# Patient Record
Sex: Female | Born: 1975 | Race: Black or African American | Hispanic: No | Marital: Married | Smoking: Never smoker
Health system: Southern US, Community
[De-identification: ages and names within clinical notes are randomized; demographics above are authoritative.]

## PROBLEM LIST (undated history)

## (undated) DIAGNOSIS — R569 Unspecified convulsions: Secondary | ICD-10-CM

## (undated) DIAGNOSIS — I10 Essential (primary) hypertension: Secondary | ICD-10-CM

## (undated) DIAGNOSIS — G44309 Post-traumatic headache, unspecified, not intractable: Secondary | ICD-10-CM

## (undated) DIAGNOSIS — S0990XS Unspecified injury of head, sequela: Secondary | ICD-10-CM

## (undated) HISTORY — PX: OTHER SURGICAL HISTORY: SHX169

## (undated) HISTORY — PX: APPENDECTOMY: SHX54

## (undated) HISTORY — PX: HERNIA REPAIR: SHX51

## (undated) HISTORY — PX: ABDOMINAL HYSTERECTOMY: SHX81

---

## 2019-10-01 ENCOUNTER — Encounter (HOSPITAL_COMMUNITY): Payer: Self-pay

## 2019-10-01 ENCOUNTER — Emergency Department (HOSPITAL_COMMUNITY)
Admission: EM | Admit: 2019-10-01 | Discharge: 2019-10-01 | Disposition: A | Discharge status: Home / Self Care | Payer: Medicare Other | Attending: Emergency Medicine | Admitting: Emergency Medicine

## 2019-10-01 ENCOUNTER — Emergency Department (HOSPITAL_COMMUNITY): Payer: Medicare Other

## 2019-10-01 DIAGNOSIS — R110 Nausea: Secondary | ICD-10-CM | POA: Insufficient documentation

## 2019-10-01 DIAGNOSIS — R42 Dizziness and giddiness: Secondary | ICD-10-CM | POA: Insufficient documentation

## 2019-10-01 DIAGNOSIS — R519 Headache, unspecified: Secondary | ICD-10-CM

## 2019-10-01 DIAGNOSIS — H53149 Visual discomfort, unspecified: Secondary | ICD-10-CM | POA: Insufficient documentation

## 2019-10-01 HISTORY — DX: Unspecified injury of head, sequela: G44.309

## 2019-10-01 HISTORY — DX: Unspecified injury of head, sequela: S09.90XS

## 2019-10-01 HISTORY — DX: Essential (primary) hypertension: I10

## 2019-10-01 HISTORY — DX: Unspecified convulsions: R56.9

## 2019-10-01 LAB — BASIC METABOLIC PANEL
Anion gap: 10 (ref 5–15)
BUN: 10 mg/dL (ref 6–20)
CO2: 26 mmol/L (ref 22–32)
Calcium: 9.1 mg/dL (ref 8.9–10.3)
Chloride: 103 mmol/L (ref 98–111)
Creatinine, Ser: 0.95 mg/dL (ref 0.44–1.00)
GFR calc Af Amer: >60 mL/min (ref >60)
GFR calc non Af Amer: >60 mL/min (ref >60)
Glucose, Bld: 92 mg/dL (ref 70–99)
Potassium: 3.7 mmol/L (ref 3.5–5.1)
Sodium: 139 mmol/L (ref 135–145)

## 2019-10-01 LAB — CBC
HCT: 38.7 % (ref 36.0–46.0)
Hemoglobin: 12.3 g/dL (ref 12.0–15.0)
MCH: 27.6 pg (ref 26.0–34.0)
MCHC: 31.8 g/dL (ref 30.0–36.0)
MCV: 87 fL (ref 80.0–100.0)
Platelets: 286 10*3/uL (ref 150–400)
RBC: 4.45 MIL/uL (ref 3.87–5.11)
RDW: 12.6 % (ref 11.5–15.5)
WBC: 4.3 10*3/uL (ref 4.0–10.5)
nRBC: 0 % (ref 0.0–0.2)

## 2019-10-01 MED ORDER — MAGNESIUM SULFATE IN D5W 1-5 GM/100ML-% IV SOLN
1.0000 g | Freq: Once | INTRAVENOUS | Status: AC
  Administered 2019-10-01: 1 g via INTRAVENOUS
  Filled 2019-10-01: qty 100

## 2019-10-01 MED ORDER — SODIUM CHLORIDE 0.9 % IV BOLUS
500.0000 mL | Freq: Once | INTRAVENOUS | Status: AC
  Administered 2019-10-01: 500 mL via INTRAVENOUS

## 2019-10-01 MED ORDER — DIPHENHYDRAMINE HCL 25 MG PO CAPS
50.0000 mg | ORAL_CAPSULE | Freq: Once | ORAL | Status: AC
  Administered 2019-10-01: 50 mg via ORAL
  Filled 2019-10-01: qty 2

## 2019-10-01 MED ORDER — DEXAMETHASONE SODIUM PHOSPHATE 10 MG/ML IJ SOLN
10.0000 mg | Freq: Once | INTRAMUSCULAR | Status: AC
  Administered 2019-10-01: 10 mg via INTRAVENOUS
  Filled 2019-10-01: qty 1

## 2019-10-01 MED ORDER — ONDANSETRON HCL 4 MG/2ML IJ SOLN
4.0000 mg | Freq: Once | INTRAMUSCULAR | Status: AC
  Administered 2019-10-01: 4 mg via INTRAVENOUS
  Filled 2019-10-01: qty 2

## 2019-10-01 NOTE — ED Provider Notes (Signed)
[facility] EMERGENCY DEPARTMENT Provider Note   CSN: [id number] Arrival date & time: 10/01/19  7616     History Chief Complaint  Patient presents with  . Chest Pain   Angela Dorsey is a 44 y.o. female with history of chronic headaches presents to the ER for evaluation of headaches.  Reports headache onset since 2001, she has had intermittent headaches occasionally since.  For the last 1 week she has not been feeling well, has not been sleeping well, has had difficulty concentrating and has had intermittent headaches.  Her headache worsened last night, states it initially was a mild headache and it gradually worsened.  Has noticed photosensitivity, lightheadedness if she stands up too quickly, worsening headache and nausea.  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  She thinks they were "expirimenting" on her with these medicines.  Remembers she was given Phenergan and Dilaudid through her IV during that ER visit.  As patient was in rehab and getting clean from multiple drug use her psychiatrist put her on Zoloft and Risperdal which she thinks made everything worse.  Patient believes that the initial migraine cocktail given to her in the ER and psychiatric medicines affected her brain chemistry.  She ultimately stopped taking all of these medicines and she has been clean for several years.  Currently she is going to school for biochemistry because she wants to study how drugs and medicines affect your brain.  Admits that lately she has been more stressed and busy working and studying.  States she is not sleeping well or taking care of herself.  Believes her lack of sleep and taking care of herself has caused this headache.  Usually at home she takes Benadryl, turmeric and pepper but these did not help.   States last time she was treated in the ER for headache she was given IV Benadryl morphine which worked.  States she cannot take ibuprofen due to ulcers or tylenol due to previous "live enzymes".  No CP and SOB.  Denies any head trauma.  Denies any fever, neck stiffness.  Denies any double vision.  No vomiting.  No stroke symptoms like difficulty with speech, one-sided weakness or numbness, difficulty with walking.  Patient is currently on her laptop, sitting up listening to a lecture.  HPI     Past Medical History:  Diagnosis Date  . Headaches due to old head injury   . Hypertension   . Seizures (HCC)     There are no problems to display for this patient.   Past Surgical History:  Procedure Laterality Date  . ABDOMINAL HYSTERECTOMY    . APPENDECTOMY    . HERNIA REPAIR    . pinky       OB History   No obstetric history on file.     No family history on file.  Social History   Tobacco Use  . Smoking status: Never Smoker  . Smokeless tobacco: Never Used  Substance Use Topics  . Alcohol use: Yes  . Drug use: Never    Home Medications Prior to Admission medications   Not on File    Allergies    Other, Tramadol, Ansaid [flurbiprofen], Penicillins, Reglan [metoclopramide], Shellfish allergy, and Toradol [ketorolac tromethamine]  Review of Systems   Review of Systems  Eyes: Positive for photophobia.  Gastrointestinal: Positive for nausea.  Neurological: Positive for light-headedness and headaches.  All other systems reviewed and are negative.   Physical Exam Updated Vital Signs BP 120/67   Pulse (!) 58   Temp 98.5 F (36.9 C) (Oral)   Resp 16   Ht 5\' 4"  (1.626 m)   Wt 77.1 kg   SpO2 99%   BMI 29.18 kg/m   Physical Exam Vitals and nursing note reviewed.  Constitutional:      General: She is not in acute distress.    Appearance: She is well-developed.     Comments: NAD.  HENT:     Head: Normocephalic and atraumatic.     Right Ear: External ear  normal.     Left Ear: External ear normal.     Nose: Nose normal.  Eyes:     General: No scleral icterus.    Conjunctiva/sclera: Conjunctivae normal.  Cardiovascular:     Rate and Rhythm: Normal rate and regular rhythm.     Heart sounds: Normal heart sounds.  Pulmonary:     Effort: Pulmonary effort is normal.     Breath sounds: Normal breath sounds.  Musculoskeletal:        General: No deformity. Normal range of motion.     Cervical back: Normal range of motion and neck supple.  Skin:    General: Skin is warm and dry.     Capillary Refill: Capillary refill takes less than 2 seconds.  Neurological:     Mental Status: She is alert and oriented to person, place, and time.     Comments:  Alert and oriented to self, place, time and event.  Speech is fluent without dysarthria or dysphasia. Strength 5/5 with hand grip and ankle F/E.   Sensation to light touch intact in face, hands and feet. Normal gait No pronator drift.  Normal finger-to-nose and feet tapping.  CN I not tested CN II grossly intact visual fields bilaterally. Unable to visualize posterior eye. CN III, IV, VI PEERL and EOMs intact bilaterally CN V light touch intact in all 3 divisions of trigeminal nerve CN VII facial movements symmetric CN VIII not tested CN IX, X no uvula deviation, symmetric rise of soft palate  CN XI 5/5 SCM and trapezius strength bilaterally  CN XII Midline tongue protrusion, symmetric L/R movements  Psychiatric:        Behavior: Behavior normal.        Thought Content: Thought content normal.        Judgment: Judgment normal.     ED Results / Procedures / Treatments   Labs (all labs ordered are listed, but only abnormal results are displayed) Labs Reviewed  CBC  BASIC METABOLIC PANEL    EKG None  Radiology DG Chest 2 View  Result Date: 10/01/2019 CLINICAL DATA:  Shortness of breath EXAM: CHEST - 2 VIEW COMPARISON:  None. FINDINGS: The heart size and mediastinal contours are  within normal limits. Both lungs are clear. The visualized skeletal structures are unremarkable. IMPRESSION: No active cardiopulmonary disease. Electronically Signed   By: Prudencio Pair M.D.   On: 10/01/2019 03:25    Procedures Ultrasound ED Peripheral IV (Provider)  Date/Time: 10/01/2019 11:21 AM Performed by: Kinnie Feil, PA-C Authorized by: Kinnie Feil, PA-C   Procedure details:    Indications: hydration and multiple failed IV attempts     Skin Prep: chlorhexidine gluconate     Location:  Left AC   Angiocath:  20 G   Bedside Ultrasound  Guided: Yes     Images: not archived     Patient tolerated procedure without complications: Yes     Dressing applied: Yes     (including critical care time)  Medications Ordered in ED Medications  magnesium sulfate IVPB 1 g 100 mL (0 g Intravenous Stopped 10/01/19 1241)  diphenhydrAMINE (BENADRYL) capsule 50 mg (50 mg Oral Given 10/01/19 1049)  dexamethasone (DECADRON) injection 10 mg (10 mg Intravenous Given 10/01/19 1019)  sodium chloride 0.9 % bolus 500 mL (0 mLs Intravenous Stopped 10/01/19 1206)  ondansetron (ZOFRAN) injection 4 mg (4 mg Intravenous Given 10/01/19 1018)    ED Course  I have reviewed the triage vital signs and the nursing notes.  Pertinent labs & imaging results that were available during my care of the patient were reviewed by me and considered in my medical decision making (see chart for details).    MDM Rules/Calculators/A&P                      EMR and nursing notes reviewed to assist with history and MDM.   Exam benign, well-appearing without no meningismus, nystagmus, focal neuro deficits, pain over temporal arteries.    Highest suspicion for tension type headache vs uncomplicated migraine.  Patient is without high-risk features of headache including: sudden/thunderclap headache, vision disturbances, AMS, seizure, fever, meningeal signs, use of anticoagulation, history aneurysms or AVMs, focal neuro  deficits, elevated BP, trauma.   Given chronicity of headaches and exam, considered infectious source like meningitis, sinusitis, intracranial bleed, space occupying lesions, CVA, dissection, temporal arteritis unlikely.   Initial ER work up started in triage including labs, CXR and EKG because patient reported feeling light headed and shortness of breath.  She states when she gets up too fast her headache gets worse it makes her light headed and nausea but she denies CP, SOB, fever, cough, palpitations. ER work up personally interpreted and  Given reassuring hx and exam, emergent imaging or labs not indicated given. We will provide migraine cocktail and reassess.    1245: Pt re-evaluated, found asleep.  Easily arousable. No changes in neuro exam.  However reports no improvement in headache.  Still 8/10.  She has received decadron, toradol, IVF, zofran, oral benadryl.  Her neuro exam is unchanged.    Given persistent headache I offered droperidol but patient states she wasn't familiar with this medicine and thinks its "experimental", she declined.  I offered fioricet but she declined stating she cannot take tylenol.  Discussed limited options for pain relief given multiple intolerances do not have many more options.  Discussed if headache is not responding to medicines we need to consider head CT but she declined stated it's not "severe". I doubt ICH.  She requested IV benadryl stating it has worked in the past. Pt has received oral benadryl.  I am not comfortable given IV benadryl again.  Will dc with neurologist for f/u. Return precautions given.   Final Clinical Impression(s) / ED Diagnoses Final diagnoses:  Recurrent headache    Rx / DC Orders ED Discharge Orders    None       Jerrell Mylar 10/01/19 1250    Tilden Fossa, MD 10/02/19 [phone]

## 2019-10-01 NOTE — Discharge Instructions (Addendum)
You were seen in the ER for headache  You had several intolerances to medicines and were not comfortable accepting others here in the ER  At this time we do not have many options for headache control.  Please follow up with neurology for further discussion of your recurrent headache  Return for sudden severe headache, fever, double vision, loss of fever, stroke symptoms

## 2019-10-01 NOTE — ED Notes (Signed)
Pt still reporting HA/nausea

## 2019-10-01 NOTE — ED Triage Notes (Signed)
Pt report that she has been having a headache with photosensitivy and nausea that has been going on for a week and tonight began to have some SOB.

## 2021-02-17 IMAGING — CR DG CHEST 2V
2 series · 2 of 2 positions shown · non-contrast
Comparison: None.

CLINICAL DATA: Shortness of breath

EXAM:
CHEST - 2 VIEW

[chest pa]
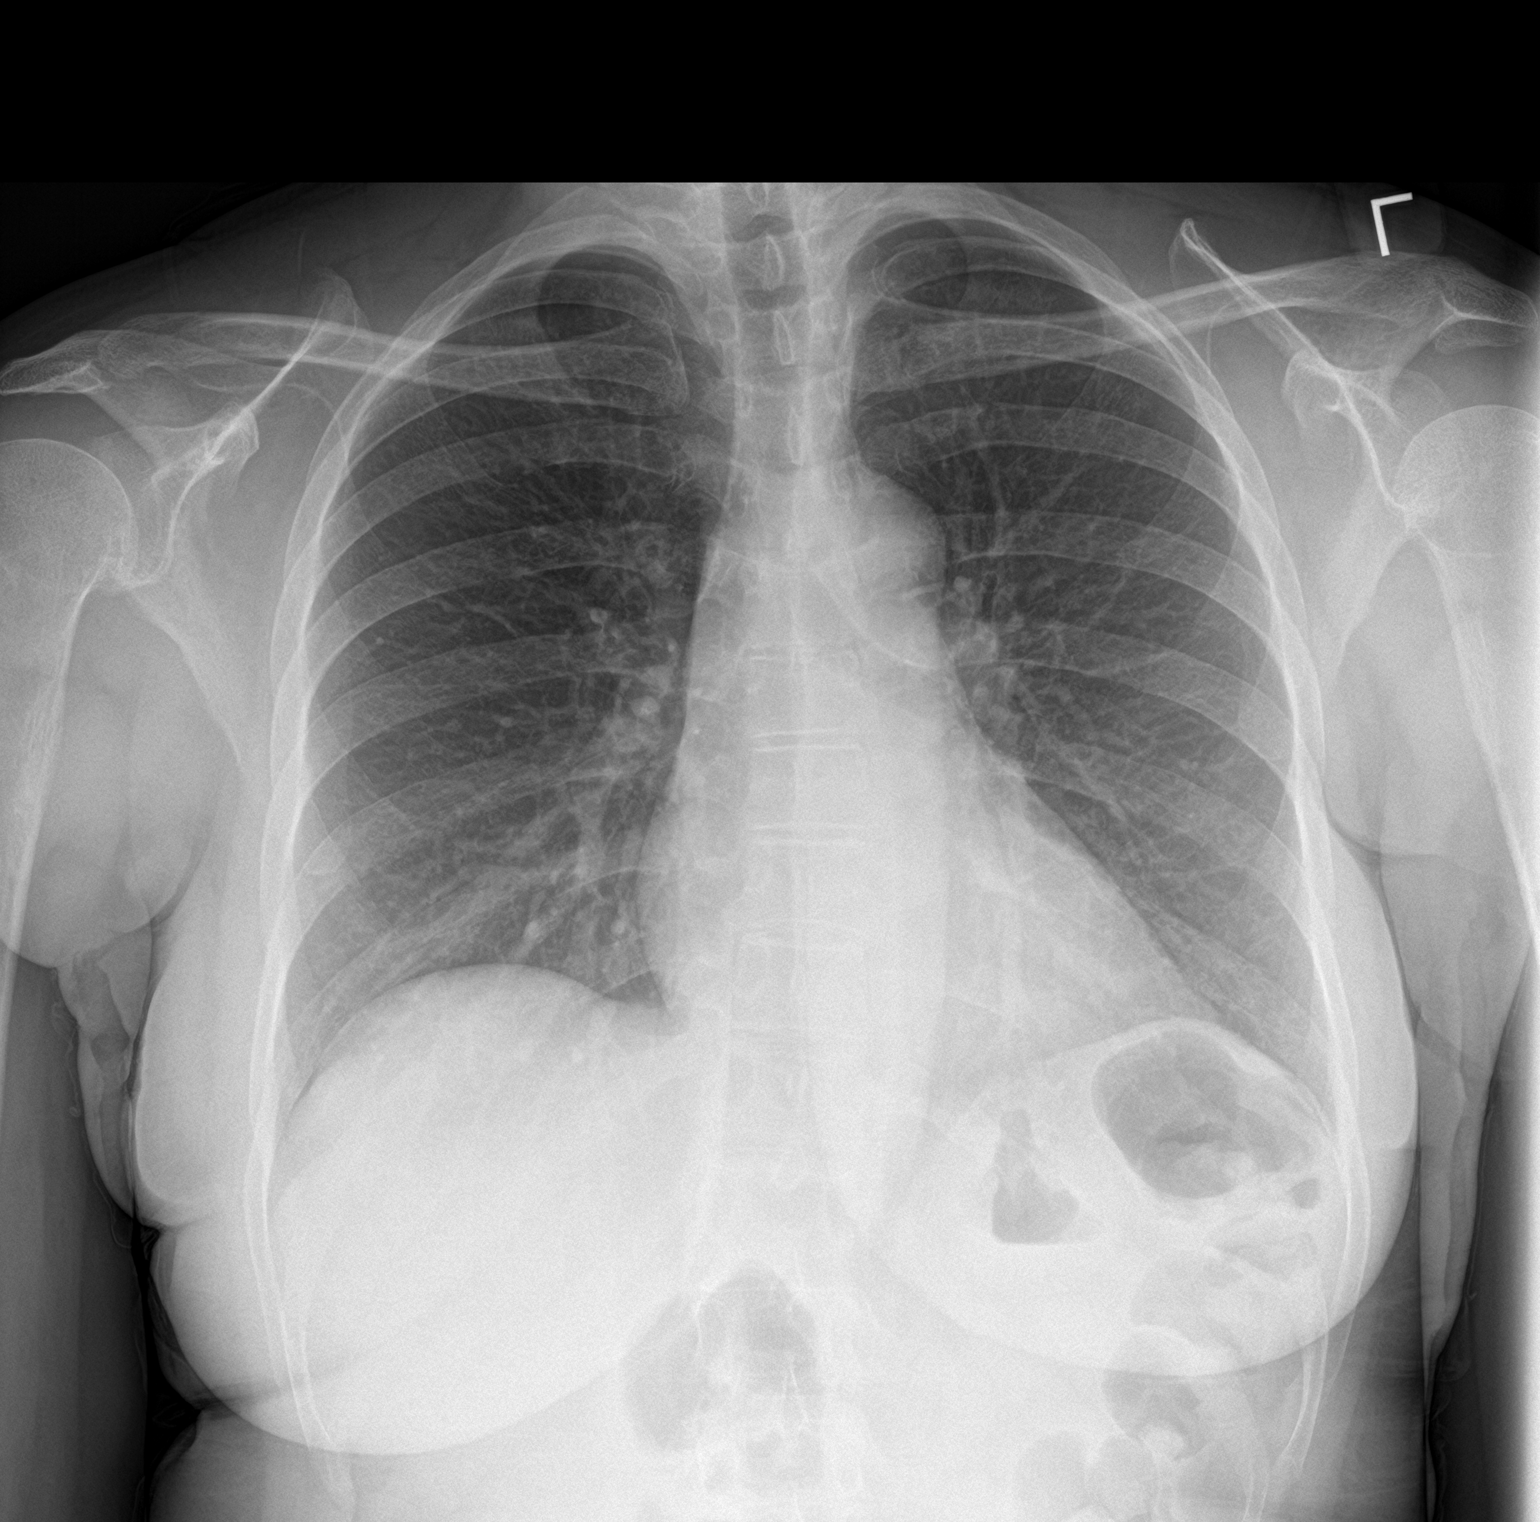

[chest lat]
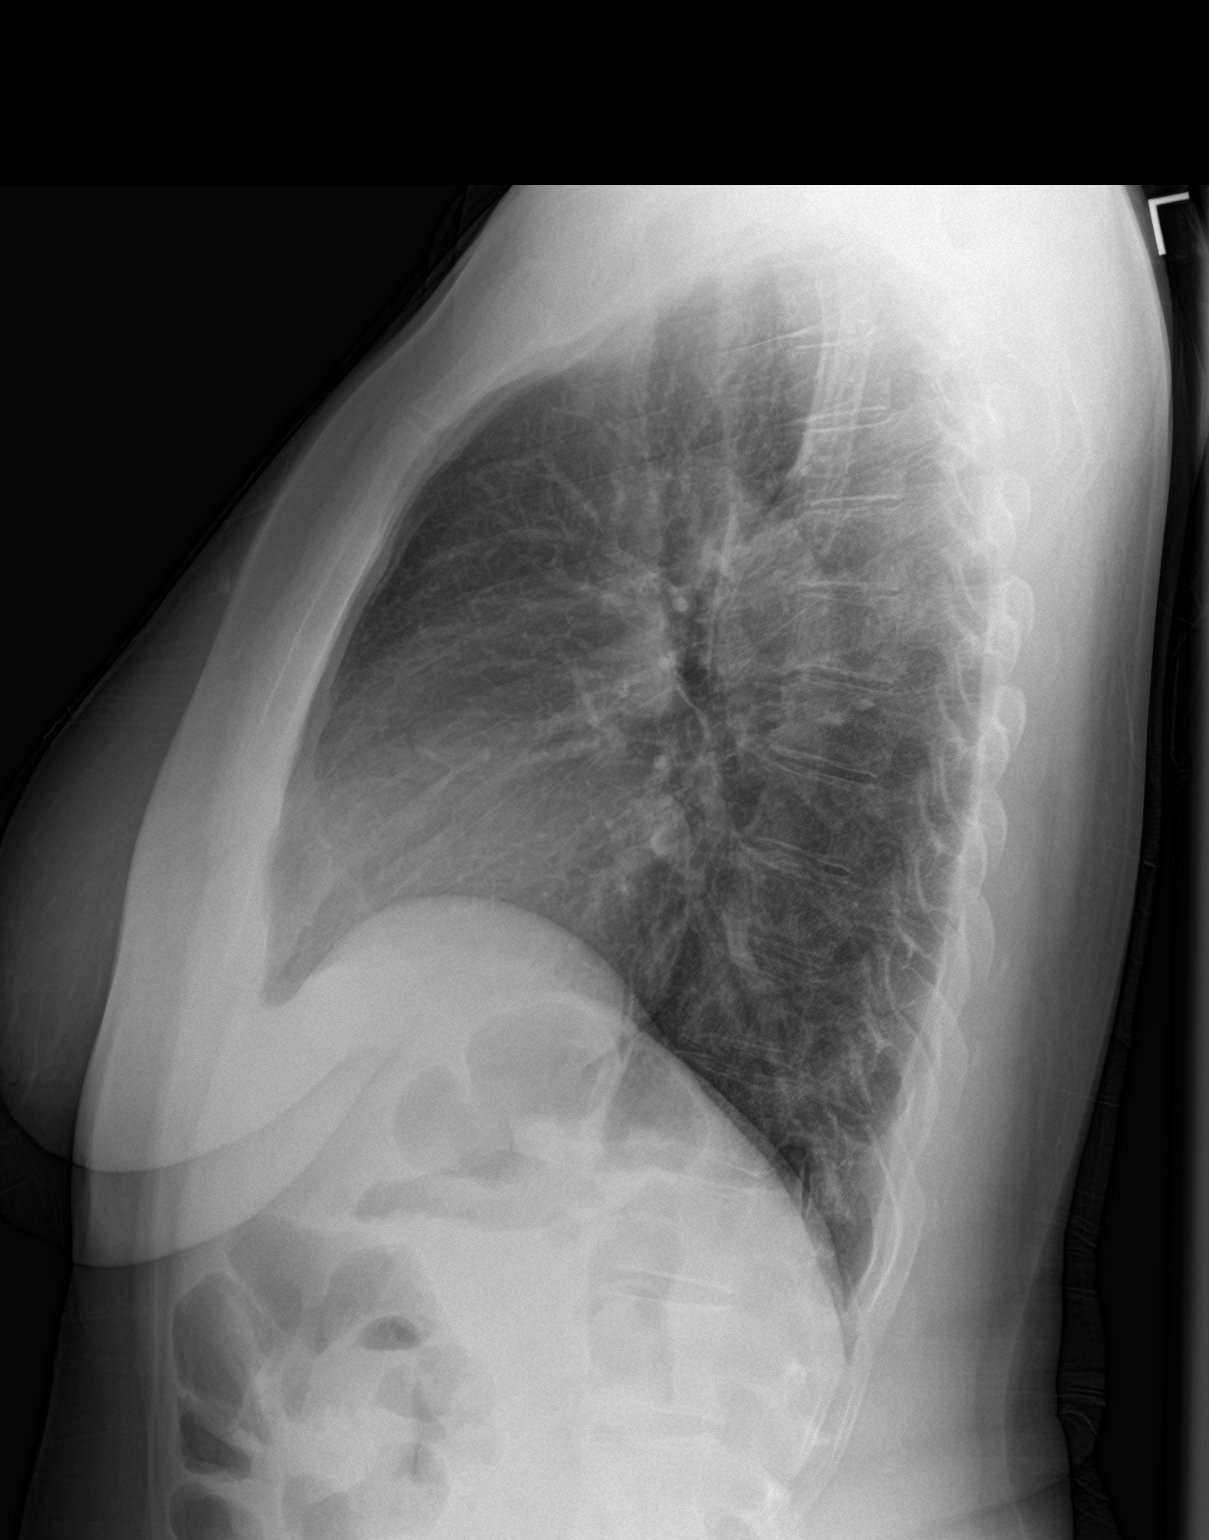

[2 of 2 positions shown; findings below may reference images not displayed]

FINDINGS: The heart size and mediastinal contours are within normal limits.
Both lungs are clear. The visualized skeletal structures are
unremarkable.
IMPRESSION: No active cardiopulmonary disease.
